# Patient Record
Sex: Male | Born: 1965 | Race: Black or African American | Hispanic: No | Marital: Married | Smoking: Never smoker
Health system: Southern US, Community
[De-identification: ages and names within clinical notes are randomized; demographics above are authoritative.]

## PROBLEM LIST (undated history)

## (undated) HISTORY — PX: KNEE ARTHROSCOPY: SUR90

---

## 2007-06-01 ENCOUNTER — Ambulatory Visit: Payer: Self-pay | Admitting: Internal Medicine

## 2009-10-28 ENCOUNTER — Emergency Department: Payer: Self-pay | Admitting: Unknown Physician Specialty

## 2011-10-22 ENCOUNTER — Ambulatory Visit: Payer: Self-pay | Admitting: Sports Medicine

## 2011-11-14 ENCOUNTER — Ambulatory Visit: Payer: Self-pay | Admitting: Orthopedic Surgery

## 2013-11-05 ENCOUNTER — Ambulatory Visit: Payer: Self-pay | Admitting: Internal Medicine

## 2013-11-06 ENCOUNTER — Ambulatory Visit: Payer: Self-pay

## 2014-09-05 ENCOUNTER — Emergency Department: Payer: Self-pay | Admitting: Emergency Medicine

## 2014-10-09 NOTE — Op Note (Signed)
PATIENT NAME:  Scott Sanders, Scott Sanders MR#:  [id number] DATE OF BIRTH:  25-Jul-1965  DATE OF PROCEDURE:  11/14/2011  PREOPERATIVE DIAGNOSIS: Medial meniscus tear, right knee.   POSTOPERATIVE DIAGNOSIS: Medial meniscus tear, right knee with lateral meniscus tear.   PROCEDURE: Arthroscopy, partial medial and lateral meniscectomy.   SURGEON: Leitha SchullerMichael J. Cledith Kamiya, MD.   ANESTHESIA:  General.   DESCRIPTION OF PROCEDURE: The patient was brought to the Operating Room and after adequate anesthesia was obtained, the right leg was placed in the arthroscopic legholder with a tourniquet applied. After patient identification and time-out procedures were carried out, an inferolateral incision was made and the arthroscope was introduced. Initial inspection revealed some mild central patellar chondromalacia and norma tracking. Coming around to the medial side, an inferomedial portal was made. There was some grade I changes to the condyles, both femoral and tibial. No fissuring of the articular cartilage. On probing, the posterior horn of the meniscus did have a vertical tear of the inner third of almost all the posterior third of the posterior horn of the meniscus. Going to the notch, the anterior cruciate ligament was intact. Going to the lateral compartment, the lateral meniscus was found to have a tear at the junction of the middle and posterior thirds. Radial tear going out approximately half of the thickness of the meniscus. The medial meniscus was then addressed with a meniscal punch and ArthroCare wand. Going to the lateral side, a meniscal punch was initially used followed by the ArthroCare wand, but a shaver was required to debris this back to a stable margin. After this was completed, pre- and postprocedures having been obtained, the gutters were checked. There were no loose bodies. After thorough irrigation of the knee, all instrumentation was withdrawn. The wounds were closed with simple interrupted 4-0 nylon. 20 mL  of 0.5% Sensorcaine infiltrated in the subcutaneous tissue for postoperative analgesia. The wound was then dressed with Xeroform, 4 x 4's, Webril, and Ace wrap. The patient was sent to the recovery room in stable condition.   ESTIMATED BLOOD LOSS: Minimal.   COMPLICATIONS: None.    SPECIMEN: None.   ____________________________ Leitha SchullerMichael J. Karista Aispuro, MD mjm:ap D: 11/14/2011 21:36:06 ET T: 11/15/2011 08:57:03 ET JOB#: [id number]  cc: Leitha SchullerMichael J. Kinsie Belford, MD, <Dictator> Leitha SchullerMICHAEL J Pema Thomure MD ELECTRONICALLY SIGNED 11/15/2011 12:22

## 2014-10-19 ENCOUNTER — Other Ambulatory Visit: Payer: Self-pay | Admitting: Family Medicine

## 2014-10-19 DIAGNOSIS — M5416 Radiculopathy, lumbar region: Secondary | ICD-10-CM

## 2014-10-27 ENCOUNTER — Ambulatory Visit
Admission: RE | Admit: 2014-10-27 | Discharge: 2014-10-27 | Disposition: A | Discharge status: Home / Self Care | Payer: Managed Care, Other (non HMO) | Source: Ambulatory Visit | Attending: Family Medicine | Admitting: Family Medicine

## 2014-10-27 DIAGNOSIS — M5416 Radiculopathy, lumbar region: Secondary | ICD-10-CM | POA: Diagnosis present

## 2014-10-27 DIAGNOSIS — M5126 Other intervertebral disc displacement, lumbar region: Secondary | ICD-10-CM | POA: Insufficient documentation

## 2015-06-20 ENCOUNTER — Ambulatory Visit
Admission: EM | Admit: 2015-06-20 | Discharge: 2015-06-20 | Disposition: A | Discharge status: Hospice | Payer: Managed Care, Other (non HMO) | Attending: Family Medicine | Admitting: Family Medicine

## 2015-06-20 DIAGNOSIS — R21 Rash and other nonspecific skin eruption: Secondary | ICD-10-CM | POA: Diagnosis not present

## 2015-06-20 DIAGNOSIS — L509 Urticaria, unspecified: Secondary | ICD-10-CM

## 2015-06-20 NOTE — ED Provider Notes (Signed)
CSN: [id number]     Arrival date & time 06/20/15  1932 History   First MD Initiated Contact with Patient 06/20/15 2043    Nurses notes were reviewed. Chief Complaint  Patient presents with  . Rash   patient reports scratching and itching in his wrist earlier today. Start having irritation of his feet to the office boot this afternoon no C had large blisters on his left foot as well. He has red rash on both extremities and both hands and wrist.  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 Reports no general malaise just some itching and pain with rashes both the hands and the feet. Still headache and like this before denies being diabetic or take any drugs. (Consider location/radiation/quality/duration/timing/severity/associated sxs/prior Treatment) HPI  No past medical history on file. No past surgical history on file. No family history on file. Social History  Substance Use Topics  . Smoking status: Never Smoker   . Smokeless tobacco: None  . Alcohol Use: No    Review of Systems  All other systems reviewed and are negative.   Allergies  Review of patient's allergies indicates no known allergies.  Home Medications   Prior to Admission medications   Medication Sig Start Date End Date Taking? Authorizing Provider  gabapentin (NEURONTIN) 300 MG capsule Take 300 mg by mouth 2 (two) times daily.   Yes Historical Provider, MD  HYDROcodone-acetaminophen (NORCO/VICODIN) 5-325 MG tablet Take 1 tablet by mouth 2 (two) times daily.   Yes Historical Provider, MD  meloxicam (MOBIC) 15 MG tablet Take 15 mg by mouth daily.   Yes Historical Provider, MD   Meds Ordered and Administered this Visit  Medications - No data to display  BP 124/88 mmHg  Pulse 72  Temp(Src) 98 F (36.7 C) (Oral)  Resp 16  Ht 6\' 1"  (1.854 m)  Wt 232 lb (105.235 kg)  BMI 30.62 kg/m2  SpO2 100% No data found.   Physical Exam  Constitutional: He is oriented to person,  place, and time. He appears well-developed and well-nourished.  HENT:  Head: Normocephalic.  Musculoskeletal: He exhibits no tenderness.  Neurological: He is alert and oriented to person, place, and time.  Skin: Rash noted.     Redness in both wrist areas and some lesions that appear to be urticaria In nature but on the left leg there are multiple blisters and this increased redness up the leg as well on the left side  Psychiatric: He has a normal mood and affect.    ED Course  Procedures (including critical care time)  Labs Review Labs Reviewed - No data to display  Imaging Review No results found.   Visual Acuity Review  Right Eye Distance:   Left Eye Distance:   Bilateral Distance:    Right Eye Near:   Left Eye Near:    Bilateral Near:         MDM   1. Rash   2. Blistering rash   3. Hives of unknown origin    Patient is going to be sent to the ED of his choice which is [facility] facility. Concerned about some type of systemic illness with all 4 extremities having this rash and in the blistering of the left leg makes me very concerned about some type of necrotizing or rapid acting rash and I think that he is probably need multiple different types of immune testing like HIV RPR CBC and possible  blood cultures as  well as starting on IV antibiotics to make sure that this is not a rapid infectious process occurring.     Hassan Rowan, MD 06/20/15 2107

## 2015-06-20 NOTE — ED Notes (Signed)
On bilateral wrists and ankles since 7:00 am today. Affected areas are red and blistered. Pt denies any new laundry detergents, lotions, exposure to new plants or chemicals.

## 2015-06-20 NOTE — Discharge Instructions (Signed)
Hives °Hives are itchy, red, puffy (swollen) areas of the skin. Hives can change in size and location on your body. Hives can come and go for hours, days, or weeks. Hives do not spread from person to person (noncontagious). Scratching, exercise, and stress can make your hives worse. °HOME CARE °· Avoid things that cause your hives (triggers). °· Take antihistamine medicines as told by your doctor. Do not drive while taking an antihistamine. °· Take any other medicines for itching as told by your doctor. °· Wear loose-fitting clothing. °· Keep all doctor visits as told. °GET HELP RIGHT AWAY IF:  °· You have a fever. °· Your tongue or lips are puffy. °· You have trouble breathing or swallowing. °· You feel tightness in the throat or chest. °· You have belly (abdominal) pain. °· You have lasting or severe itching that is not helped by medicine. °· You have painful or puffy joints. °These problems may be the first sign of a life-threatening allergic reaction. Call your local emergency services (911 in [place] °MAKE SURE YOU:  °· Understand these instructions. °· Will watch your condition. °· Will get help right away if you are not doing well or get worse. °  °This information is not intended to replace advice given to you by your health care provider. Make sure you discuss any questions you have with your health care provider. °  °Document Released: 03/12/2008 Document Revised: 12/03/2011 Document Reviewed: 08/27/2011 °Elsevier Interactive Patient Education ©2016 [employer] ° °

## 2015-12-29 ENCOUNTER — Ambulatory Visit (INDEPENDENT_AMBULATORY_CARE_PROVIDER_SITE_OTHER): Payer: Worker's Compensation

## 2015-12-29 ENCOUNTER — Ambulatory Visit
Admission: EM | Admit: 2015-12-29 | Discharge: 2015-12-29 | Disposition: A | Discharge status: Home / Self Care | Payer: Worker's Compensation | Attending: Emergency Medicine | Admitting: Emergency Medicine

## 2015-12-29 ENCOUNTER — Encounter: Payer: Self-pay | Admitting: *Deleted

## 2015-12-29 DIAGNOSIS — M795 Residual foreign body in soft tissue: Secondary | ICD-10-CM

## 2015-12-29 DIAGNOSIS — M549 Dorsalgia, unspecified: Secondary | ICD-10-CM

## 2015-12-29 DIAGNOSIS — S81832A Puncture wound without foreign body, left lower leg, initial encounter: Secondary | ICD-10-CM

## 2015-12-29 MED ORDER — SULFAMETHOXAZOLE-TRIMETHOPRIM 800-160 MG PO TABS: 1.0000 | ORAL_TABLET | Freq: Two times a day (BID) | ORAL | Status: DC

## 2015-12-29 MED ORDER — LIDOCAINE-EPINEPHRINE 1 %-1:100000 IJ SOLN
10.0000 mL | Freq: Once | INTRAMUSCULAR | Status: AC
  Administered 2015-12-29: 1 mL via INTRADERMAL

## 2015-12-29 MED ORDER — ACETAMINOPHEN 500 MG PO TABS
1000.0000 mg | ORAL_TABLET | Freq: Once | ORAL | Status: AC
  Administered 2015-12-29: 1000 mg via ORAL

## 2015-12-29 MED ORDER — MUPIROCIN 2 % EX OINT: 1.0000 "application " | TOPICAL_OINTMENT | Freq: Two times a day (BID) | CUTANEOUS | Status: AC

## 2015-12-29 MED ORDER — IBUPROFEN 800 MG PO TABS: 800.0000 mg | ORAL_TABLET | Freq: Three times a day (TID) | ORAL | Status: DC

## 2015-12-29 MED ORDER — ACETAMINOPHEN 500 MG PO TABS: 1000.0000 mg | ORAL_TABLET | Freq: Four times a day (QID) | ORAL | Status: AC | PRN

## 2015-12-29 NOTE — ED Notes (Signed)
Patient was hit in his left lower leg by a flying piece of metal while at work today. Metal may still be in wound.

## 2015-12-29 NOTE — ED Provider Notes (Signed)
CSN: [id number]     Arrival date & time 12/29/15  1435 History   First MD Initiated Contact with Patient 12/29/15 1521     Chief Complaint  Patient presents with  . Extremity Laceration   (Consider location/radiation/quality/duration/timing/severity/associated sxs/prior Treatment) HPI Comments: [place] male with puncture wound left lower leg at [facility] around 1430 trying to fix machine that extrudes plastic unsure what punctured his skin bleeding controlled with pressure applied to area, area tender to touch, tetanus up to date pain with weight bearing  The history is provided by the patient.    History reviewed. No pertinent past medical history. History reviewed. No pertinent past surgical history. History reviewed. No pertinent family history. Social History  Substance Use Topics  . Smoking status: Never Smoker   . Smokeless tobacco: Never Used  . Alcohol Use: No    Review of Systems  Constitutional: Negative for fever, chills, activity change and appetite change.  HENT: Negative for congestion, ear pain and sore throat.   Eyes: Negative for photophobia, pain, discharge, redness, itching and visual disturbance.  Respiratory: Negative for cough and wheezing.   Cardiovascular: Negative for chest pain and leg swelling.  Gastrointestinal: Negative for nausea, vomiting, diarrhea, constipation and blood in stool.  Endocrine: Negative for polydipsia, polyphagia and polyuria.  Genitourinary: Negative for dysuria, hematuria and difficulty urinating.  Musculoskeletal: Positive for myalgias and gait problem. Negative for back pain and arthralgias.  Skin: Positive for wound. Negative for color change, pallor and rash.  Allergic/Immunologic: Negative for environmental allergies and food allergies.  Neurological: Negative for headaches.  Hematological: Negative for adenopathy. Does not bruise/bleed easily.  Psychiatric/Behavioral: Negative for confusion, sleep disturbance and  agitation. The patient is not nervous/anxious.     Allergies  Review of patient's allergies indicates no known allergies.  Home Medications   Prior to Admission medications   Medication Sig Start Date End Date Taking? Authorizing Provider  acetaminophen (TYLENOL) 500 MG tablet Take 2 tablets (1,000 mg total) by mouth every 6 (six) hours as needed for mild pain or moderate pain. 12/29/15 12/31/15  Barbaraann Barthel, NP  ibuprofen (ADVIL,MOTRIN) 800 MG tablet Take 1 tablet (800 mg total) by mouth 3 (three) times daily. 12/29/15   Barbaraann Barthel, NP  mupirocin ointment (BACTROBAN) 2 % Apply 1 application topically 2 (two) times daily. Affected area until healed 12/29/15 01/07/16  Barbaraann Barthel, NP  sulfamethoxazole-trimethoprim (BACTRIM DS,SEPTRA DS) 800-160 MG tablet Take 1 tablet by mouth 2 (two) times daily. 12/29/15   Barbaraann Barthel, NP   Meds Ordered and Administered this Visit   Medications  acetaminophen (TYLENOL) tablet 1,000 mg (1,000 mg Oral Given 12/29/15 1625)  lidocaine-EPINEPHrine (XYLOCAINE W/EPI) 1 %-1:100000 (with pres) injection 10 mL (1 mL Intradermal Given 12/29/15 1725)    BP 138/71 mmHg  Pulse 72  Temp(Src) 98 F (36.7 C) (Oral)  Resp 18  Ht 6\' 1"  (1.854 m)  Wt 230 lb (104.327 kg)  BMI 30.35 kg/m2  SpO2 100% No data found.   Physical Exam  Constitutional: He is oriented to person, place, and time. Vital signs are normal. He appears well-developed and well-nourished.  Non-toxic appearance. He does not have a sickly appearance. He does not appear ill. No distress.  HENT:  Head: Normocephalic and atraumatic.  Right Ear: Hearing and external ear normal.  Left Ear: Hearing and external ear normal.  Nose: Nose normal.  Mouth/Throat: Uvula is midline, oropharynx is clear and moist and mucous membranes are normal.  He does not have dentures. No oral lesions. No trismus in the jaw. Normal dentition. No dental abscesses, uvula swelling, lacerations or dental caries.   Eyes: Conjunctivae, EOM and lids are normal. Pupils are equal, round, and reactive to light. Right eye exhibits no discharge. Left eye exhibits no discharge. No scleral icterus.  Neck: Trachea normal and normal range of motion. Neck supple. No spinous process tenderness and no muscular tenderness present. No rigidity. No tracheal deviation, no edema, no erythema and normal range of motion present.  Cardiovascular: Normal rate, regular rhythm, normal heart sounds and intact distal pulses.  Exam reveals no gallop and no friction rub.   No murmur heard. Pulses:      Dorsalis pedis pulses are 2+ on the right side, and 2+ on the left side.  Pulmonary/Chest: Effort normal and breath sounds normal. No accessory muscle usage or stridor. No respiratory distress. He has no decreased breath sounds. He has no wheezes. He has no rhonchi. He has no rales. He exhibits no tenderness.  Abdominal: Soft. Normal appearance. He exhibits no distension.  Musculoskeletal: Normal range of motion. He exhibits edema and tenderness.       Right shoulder: Normal.       Left shoulder: Normal.       Right hip: Normal.       Left hip: Normal.       Right knee: Normal.       Left knee: Normal.       Right ankle: Normal.       Left ankle: Normal.       Cervical back: Normal.       Right hand: Normal.       Left hand: Normal.       Right lower leg: Normal.       Left lower leg: He exhibits tenderness, swelling, edema and laceration. He exhibits no deformity.       Legs: Neurological: He is alert and oriented to person, place, and time. He has normal strength. He displays no atrophy and no tremor. No cranial nerve deficit or sensory deficit. He exhibits normal muscle tone. He displays no seizure activity. Gait abnormal. Coordination normal. GCS eye subscore is 4. GCS verbal subscore is 5. GCS motor subscore is 6.  Limping strength 5/5 bilateral extremities  Skin: Skin is warm. Laceration noted. No abrasion, no bruising, no  burn, no ecchymosis, no lesion, no petechiae and no rash noted. He is not diaphoretic. There is erythema. No cyanosis. No pallor. Nails show no clubbing.  Psychiatric: He has a normal mood and affect. His speech is normal and behavior is normal. Judgment and thought content normal. Cognition and memory are normal.    ED Course  Procedures (including critical care time)  Labs Review Labs Reviewed - No data to display  Imaging Review Dg Tibia/fibula Left  12/29/2015  ADDENDUM REPORT: 12/29/2015 17:25 ADDENDUM: Radio-opaque foreign bodies are identified within the medial soft tissues of the left lower leg. Electronically Signed   By: Signa Kell M.D.   On: 12/29/2015 17:25  12/29/2015  CLINICAL DATA:  Laceration from piece of metal. EXAM: LEFT TIBIA AND FIBULA - 2 VIEW COMPARISON:  None FINDINGS: A fiducial marker was placed over the area of concern. Within the underlying soft tissues there are two small radio-opaque foreign bodies which up to 7 mm. No fracture or subluxation identified. IMPRESSION: 1. Radio-opaque foreign bodies are identified within the medial soft tissues of the right lower leg. Electronically Signed:  By: Signa Kellaylor  Stroud M.D. On: 12/29/2015 15:46    Xray for foreign body/fracture pending 1521  Simple dressing and wound care completed by RN Alphonzo LemmingsSteve Holmes refused offer tylenol or motrin at this time.  1605 patient notified 2 foreign bodies noted on xray given copy of report.  Awaiting Dr Chaney MallingMortenson to perform foreign body removal as splint my left hand unable to wear glove/manipulate tools.  Patient verbalized understanding information and had no further questions at this time.  Medications  acetaminophen (TYLENOL) tablet 1,000 mg (1,000 mg Oral Given 12/29/15 1625)  lidocaine-EPINEPHrine (XYLOCAINE W/EPI) 1 %-1:100000 (with pres) injection 10 mL (1 mL Intradermal Given 12/29/15 1725)  by RN Alphonzo LemmingsSteve Holmes as patient changed mind requested pain medication while waiting for foreign  body removal.  1725 foreign body removal and simple suturing completed by Dr Dimitri PedAshley Mortensen see her procedure note.  Simple dressing LLE CDI patient ambulated to bathroom without difficulty and back to exam room after procedure.  Reviewed discharge instructions with patient and [facility]  Patient verbalized understanding info/instructions, agreed plan of care and no further questions at this time.   MDM   1. Puncture wound of leg excluding thigh, left, initial encounter   2. Foreign body (FB) in soft tissue    Patient was instructed to keep wound covered with bandaid apply triple antibiotic daily after showering.  Follow up in 10 days for suture removal.  48 hours work excuse given to patient.  Monitor for erythema worsening, purulent discharge, worsening pain and if noted start bactroban and bactrim DS po BID x 7 days.  Do not soak toe until wound healed avoid pool, lake, hot tub.   Exitcare handout on puncture wound, cellulitis, osteomyelitis given to patient.  Tylenol 1000mg  po QID prn or motrin 800mg  po TID prn pain.   Medications as directed.  Call or return to clinic as needed if these symptoms worsen or fail to improve as anticipated..  Patient verbalized agreement and understanding of treatment plan and had no further questions at this time.  P2:  ROM, injury prevention    Barbaraann Barthelina A Betancourt, NP 12/29/15 1802

## 2015-12-29 NOTE — Discharge Instructions (Signed)
Puncture Wound A puncture wound is an injury that is caused by a sharp, thin object that goes through (penetrates) your skin. Usually, a puncture wound does not leave a large opening in your skin, so it may not bleed a lot. However, when you get a puncture wound, dirt or other materials (foreign bodies) can be forced into your wound and break off inside. This increases the chance of infection, such as tetanus. CAUSES Puncture wounds are caused by any sharp, thin object that goes through your skin, such as:  Animal teeth, as with an animal bite.  Sharp, pointed objects, such as nails, splinters of glass, fishhooks, and needles. SYMPTOMS Symptoms of a puncture wound include:  Pain.  Bleeding.  Swelling.  Bruising.  Fluid leaking from the wound.  Numbness, tingling, or loss of function. DIAGNOSIS This condition is diagnosed with a medical history and physical exam. Your wound will be checked to see if it contains any foreign bodies. You may also have X-rays or other imaging tests. TREATMENT Treatment for a puncture wound depends on how serious the wound is. It also depends on whether the wound contains any foreign bodies. Treatment for all types of puncture wounds usually starts with:  Controlling the bleeding.  Washing out the wound with a germ-free (sterile) salt-water solution.  Checking the wound for foreign bodies. Treatment may also include:  Having the wound opened surgically to remove a foreign object.  Closing the wound with stitches (sutures) if it continues to bleed.  Covering the wound with antibiotic ointments and a bandage (dressing).  Receiving a tetanus shot.  Receiving a rabies vaccine. HOME CARE INSTRUCTIONS Medicines  Take or apply over-the-counter and prescription medicines only as told by your health care provider.  If you were prescribed an antibiotic, take or apply it as told by your health care provider. Do not stop using the antibiotic even if  your condition improves. Wound Care  There are many ways to close and cover a wound. For example, a wound can be covered with sutures, skin glue, or adhesive strips. Follow instructions from your health care provider about:  How to take care of your wound.  When and how you should change your dressing.  When you should remove your dressing.  Removing whatever was used to close your wound.  Keep the dressing dry as told by your health care provider. Do not take baths, swim, use a hot tub, or do anything that would put your wound underwater until your health care provider approves.  Clean the wound as told by your health care provider.  Do not scratch or pick at the wound.  Check your wound every day for signs of infection. Watch for:  Redness, swelling, or pain.  Fluid, blood, or pus. General Instructions  Raise (elevate) the injured area above the level of your heart while you are sitting or lying down.  If your puncture wound is in your foot, ask your health care provider if you need to avoid putting weight on your foot and for how long.  Keep all follow-up visits as told by your health care provider. This is important. SEEK MEDICAL CARE IF:  You received a tetanus shot and you have swelling, severe pain, redness, or bleeding at the injection site.  You have a fever.  Your sutures come out.  You notice a bad smell coming from your wound or your dressing.  You notice something coming out of your wound, such as wood or glass.  Your  pain is not controlled with medicine.  You have increased redness, swelling, or pain at the site of your wound.  You have fluid, blood, or pus coming from your wound.  You notice a change in the color of your skin near your wound.  You need to change the dressing frequently due to fluid, blood, or pus draining from your wound.  You develop a new rash.  You develop numbness around your wound. SEEK IMMEDIATE MEDICAL CARE IF:  You  develop severe swelling around your wound.  Your pain suddenly increases and is severe.  You develop painful skin lumps.  You have a red streak going away from your wound.  The wound is on your hand or foot and you cannot properly move a finger or toe.  The wound is on your hand or foot and you notice that your fingers or toes look pale or bluish.   This information is not intended to replace advice given to you by your health care provider. Make sure you discuss any questions you have with your health care provider.   Document Released: 03/13/2005 Document Revised: 02/22/2015 Document Reviewed: 07/27/2014 Elsevier Interactive Patient Education 2016 Elsevier Inc. Bone and Joint Infections, Adult Bone infections (osteomyelitis) and joint infections (septic arthritis) occur when bacteria or other germs get inside a bone or a joint. This can happen if you have an infection in another part of your body that spreads through your blood. Germs from your skin or from outside of your body can also cause this type of infection if you have a wound or a broken bone (fracture) that breaks the skin. Anyone can get a bone infection or joint infection. You may be more likely to get this type of infection if you have a condition, such as diabetes, that lowers your ability to fight infection or increases your chances of getting an infection. Bone and joint infections can cause damage, and they can spread to other areas of your body. They need to be treated quickly. CAUSES Most bone and joint infections are caused by bacteria. They can also be caused by other germs, such as viruses and funguses. RISK FACTORS This condition is more likely to develop in:  People who recently had surgery, especially bone or joint surgery.  People who have a long-term (chronic) disease, such as:  HIV (human immunodeficiency virus).  Diabetes.  Rheumatoid arthritis.  Sickle cell anemia.  Elderly people.  People who  take medicines that block or weaken the body's defense system (immune system).  People who have a condition that reduces their blood flow.  People who are on kidney dialysis.  People who have an artificial joint.  People who have had a joint or bone repaired with plates or screws (surgical hardware).  People who use or abuse IV drugs.  People who have had trauma, such as stepping on a nail. SYMPTOMS Symptoms vary depending on the type and location of your infection. Common symptoms of bone and joint infections include:  Fever and chills.  Redness and warmth.  Swelling.  Pain and stiffness.  Drainage of fluid or pus near the infection.  Weight loss and fatigue.  Decreased ability to use a hand or foot. DIAGNOSIS This condition may be diagnosed based on symptoms, medical history, a physical exam, and diagnostic tests. Tests can help to identify the cause of the infection. You may have various tests, such as:  A sample of tissue, fluid, or blood taken to be examined under a microscope.  A procedure to remove fluid from the infected joint with a needle (joint aspiration) for testing in a lab.  Pus or discharge swabbed from a wound for testing to identify germs and to determine what type of medicine will kill them (culture and sensitivity).  Blood tests to look for evidence of infection and inflammation (biomarkers).  Imaging studies to determine how severe the bone or joint infection is. These may include:  X-rays.  CT scan.  MRI.  Bone scan. TREATMENT Treatment depends on the cause and type of infection. Antibiotic medicines are usually the first treatment for a bone or joint infection. Treatment with antibiotics may include:  Getting IV antibiotics. This may be done in a hospital at first. You may have to continue IV antibiotics at home for several weeks. You may also have to take antibiotics by mouth for several weeks after that.  Taking more than one kind of  antibiotic. Treatment may start with a type of antibiotic that works against many different bacteria (broad spectrumantibiotics). IV antibiotics may be changed if tests show that another type may work better. Other treatments may include:  Draining fluid from the joint by placing a needle into it (aspiration).  Surgery to remove:  Dead or dying tissue from a bone or joint.  An infected artificial joint.  Infected plates or screws that were used to repair a broken bone. HOME CARE INSTRUCTIONS  Take medicines only as directed by your health care provider.  Take your antibiotic medicine as directed by your health care provider. Finish the antibiotic even if you start to feel better.  Follow instructions from your health care provider about how to take IV antibiotics at home.  Ask your health care provider if you have any restrictions on your activities.  Keep all follow-up visits as directed by your health care provider. This is important. SEEK MEDICAL CARE IF:  You have a fever or chills.  You have redness, warmth, pain, or swelling that returns after treatment. SEEK IMMEDIATE MEDICAL CARE IF:  You have rapid breathing or you have trouble breathing.  You have chest pain.  You cannot drink fluids or make urine.  The affected arm or leg swells, changes color, or turns blue.   This information is not intended to replace advice given to you by your health care provider. Make sure you discuss any questions you have with your health care provider.   Document Released: 06/03/2005 Document Revised: 10/18/2014 Document Reviewed: 06/01/2014 Elsevier Interactive Patient Education 2016 Elsevier Inc. Cellulitis Cellulitis is an infection of the skin and the tissue beneath it. The infected area is usually red and tender. Cellulitis occurs most often in the arms and lower legs.  CAUSES  Cellulitis is caused by bacteria that enter the skin through cracks or cuts in the skin. The most  common types of bacteria that cause cellulitis are staphylococci and streptococci. SIGNS AND SYMPTOMS   Redness and warmth.  Swelling.  Tenderness or pain.  Fever. DIAGNOSIS  Your health care provider can usually determine what is wrong based on a physical exam. Blood tests may also be done. TREATMENT  Treatment usually involves taking an antibiotic medicine. HOME CARE INSTRUCTIONS   Take your antibiotic medicine as directed by your health care provider. Finish the antibiotic even if you start to feel better.  Keep the infected arm or leg elevated to reduce swelling.  Apply a warm cloth to the affected area up to 4 times per day to relieve pain.  Take  medicines only as directed by your health care provider.  Keep all follow-up visits as directed by your health care provider. SEEK MEDICAL CARE IF:   You notice red streaks coming from the infected area.  Your red area gets larger or turns dark in color.  Your bone or joint underneath the infected area becomes painful after the skin has healed.  Your infection returns in the same area or another area.  You notice a swollen bump in the infected area.  You develop new symptoms.  You have a fever. SEEK IMMEDIATE MEDICAL CARE IF:   You feel very sleepy.  You develop vomiting or diarrhea.  You have a general ill feeling (malaise) with muscle aches and pains.   This information is not intended to replace advice given to you by your health care provider. Make sure you discuss any questions you have with your health care provider.   Document Released: 03/13/2005 Document Revised: 02/22/2015 Document Reviewed: 08/19/2011 Elsevier Interactive Patient Education 2016 Elsevier Inc. Sutured Wound Care Sutures are stitches that can be used to close wounds. Taking care of your wound properly can help to prevent pain and infection. It can also help your wound to heal more quickly. HOW TO CARE FOR YOUR SUTURED WOUND Wound  Care  Keep the wound clean and dry.  If you were given a bandage (dressing), you should change it at least once per day or as directed by your health care provider. You should also change it if it becomes wet or dirty.  Keep the wound completely dry for the first 24 hours or as directed by your health care provider. After that time, you may shower or bathe. However, make sure that the wound is not soaked in water until the sutures have been removed.  Clean the wound one time each day or as directed by your health care provider.  Wash the wound with soap and water.  Rinse the wound with water to remove all soap.  Pat the wound dry with a clean towel. Do not rub the wound.  Aftercleaning the wound, apply a thin layer of antibioticointment as directed by your health care provider. This will help to prevent infection and keep the dressing from sticking to the wound.  Have the sutures removed as directed by your health care provider. General Instructions  Take or apply medicines only as directed by your health care provider.  To help prevent scarring, make sure to cover your wound with sunscreen whenever you are outside after the sutures are removed and the wound is healed. Make sure to wear a sunscreen of at least 30 SPF.  If you were prescribed an antibiotic medicine or ointment, finish all of it even if you start to feel better.  Do not scratch or pick at the wound.  Keep all follow-up visits as directed by your health care provider. This is important.  Check your wound every day for signs of infection. Watch for:   Redness, swelling, or pain.  Fluid, blood, or pus.  Raise (elevate) the injured area above the level of your heart while you are sitting or lying down, if possible.  Avoid stretching your wound.  Drink enough fluids to keep your urine clear or pale yellow. SEEK MEDICAL CARE IF:  You received a tetanus shot and you have swelling, severe pain, redness, or  bleeding at the injection site.  You have a fever.  A wound that was closed breaks open.  You notice a bad smell  coming from the wound.  You notice something coming out of the wound, such as wood or glass.  Your pain is not controlled with medicine.  You have increased redness, swelling, or pain at the site of your wound.  You have fluid, blood, or pus coming from your wound.  You notice a change in the color of your skin near your wound.  You need to change the dressing frequently due to fluid, blood, or pus draining from the wound.  You develop a new rash.  You develop numbness around the wound. SEEK IMMEDIATE MEDICAL CARE IF:  You develop severe swelling around the injury site.  Your pain suddenly increases and is severe.  You develop painful lumps near the wound or on skin that is anywhere on your body.  You have a red streak going away from your wound.  The wound is on your hand or foot and you cannot properly move a finger or toe.  The wound is on your hand or foot and you notice that your fingers or toes look pale or bluish.   This information is not intended to replace advice given to you by your health care provider. Make sure you discuss any questions you have with your health care provider.   Document Released: 07/11/2004 Document Revised: 10/18/2014 Document Reviewed: 01/13/2013 Elsevier Interactive Patient Education [employer]2016 Elsevier Inc.

## 2015-12-29 NOTE — ED Provider Notes (Signed)
Procedure note: After obtaining verbal consent, the area was cleansed with chlorhexidine gluconate. Using sterile technique, 3 mL of lidocaine 1% with epinephrine was infiltrated with good local anesthesia. The wound was extended approximately 2 mm and explored. 2 metallic foreign bodies were removed in their entirety. Wound was then irrigated with 120 cc of sterile saline. A single vertical mattress and single interrupted stitch was placed to close the wound using 4-0 Ethilon. Bacitracin and pressure dressing applied afterwards. Patient tolerated procedure well.  Domenick GongAshley Mickenzie Stolar, MD 12/29/15 1714

## 2015-12-30 ENCOUNTER — Encounter: Payer: Self-pay | Admitting: *Deleted

## 2015-12-30 ENCOUNTER — Ambulatory Visit
Admission: EM | Admit: 2015-12-30 | Discharge: 2015-12-30 | Disposition: A | Discharge status: Home / Self Care | Payer: Worker's Compensation | Attending: Internal Medicine | Admitting: Internal Medicine

## 2015-12-30 DIAGNOSIS — M795 Residual foreign body in soft tissue: Secondary | ICD-10-CM | POA: Diagnosis not present

## 2015-12-30 DIAGNOSIS — S81832D Puncture wound without foreign body, left lower leg, subsequent encounter: Secondary | ICD-10-CM | POA: Diagnosis not present

## 2015-12-30 MED ORDER — ETODOLAC 500 MG PO TABS: 500.0000 mg | ORAL_TABLET | Freq: Two times a day (BID) | ORAL | Status: DC

## 2015-12-30 MED ORDER — HYDROCODONE-ACETAMINOPHEN 5-325 MG PO TABS: 1.0000 | ORAL_TABLET | Freq: Three times a day (TID) | ORAL | Status: DC | PRN

## 2015-12-30 NOTE — ED Provider Notes (Signed)
CSN: [id number]     Arrival date & time 12/30/15  1025 History   First MD Initiated Contact with Patient 12/30/15 1041     Chief Complaint  Patient presents with  . Leg Pain   HPI The patient presents today with increased left leg pain.  [facility] with [facility].  X-rays demonstrated a metallic foreign object which was removed in the office.  Sutures were placed and he was given tylenol for pain.  He reports that the pain around the incision is extremely painful, describes it as a sharp sensation.  Denies any radiation of the pain, or N/T in the left lower extremity.  Denies any fevers or drainage from the incision site, presents today for further pain control.  History reviewed. No pertinent past medical history. History reviewed. No pertinent past surgical history. History reviewed. No pertinent family history. Social History  Substance Use Topics  . Smoking status: Never Smoker   . Smokeless tobacco: Never Used  . Alcohol Use: No    Review of Systems  Constitutional: Negative.   HENT: Negative.   Eyes: Negative.   Respiratory: Negative.   Cardiovascular: Negative.   Gastrointestinal: Negative.   Endocrine: Negative.   Genitourinary: Negative.   Musculoskeletal: Positive for myalgias and arthralgias.  Skin: Negative.   Allergic/Immunologic: Negative.   Neurological: Negative.   Hematological: Negative.   Psychiatric/Behavioral: Negative.     Allergies  Review of patient's allergies indicates no known allergies.  Home Medications   Prior to Admission medications   Medication Sig Start Date End Date Taking? Authorizing Provider  acetaminophen (TYLENOL) 500 MG tablet Take 2 tablets (1,000 mg total) by mouth every 6 (six) hours as needed for mild pain or moderate pain. 12/29/15 12/31/15 Yes Barbaraann Barthelina A Betancourt, NP  ibuprofen (ADVIL,MOTRIN) 800 MG tablet Take 1 tablet (800 mg  total) by mouth 3 (three) times daily. 12/29/15  Yes Barbaraann Barthelina A Betancourt, NP  mupirocin ointment (BACTROBAN) 2 % Apply 1 application topically 2 (two) times daily. Affected area until healed 12/29/15 01/07/16 Yes Tina A Betancourt, NP  sulfamethoxazole-trimethoprim (BACTRIM DS,SEPTRA DS) 800-160 MG tablet Take 1 tablet by mouth 2 (two) times daily. 12/29/15  Yes Barbaraann Barthelina A Betancourt, NP  etodolac (LODINE) 500 MG tablet Take 1 tablet (500 mg total) by mouth 2 (two) times daily. 12/30/15   Anson OregonJames Lance McGhee, PA-C  HYDROcodone-acetaminophen (NORCO) 5-325 MG tablet Take 1 tablet by mouth every 8 (eight) hours as needed for moderate pain. 12/30/15   Anson OregonJames Lance McGhee, PA-C   Meds Ordered and Administered this Visit  Medications - No data to display  BP 128/94 mmHg  Pulse 76  Temp(Src) 98.2 F (36.8 C) (Oral)  Resp 18  SpO2 100% No data found.  Physical Exam  skin inspection of the left lower extremity demonstrates that be incision site along the medial aspect of the left tibia is intact.  Mild swelling at the site of the incision There is no drainage or erythema. The patient has full range of motion left knee and left ankle without pain. There is moderate tenderness palpation around the incision site, compartments of the left leg are soft. Sutures remain intact.  ED Course  Procedures (including critical care time)  Labs Review Labs Reviewed - No data to display  Imaging Review Dg Tibia/fibula Left  12/29/2015  ADDENDUM REPORT: 12/29/2015 17:25 ADDENDUM: Radio-opaque foreign bodies  are identified within the medial soft tissues of the left lower leg. Electronically Signed   By: Signa Kell M.D.   On: 12/29/2015 17:25  12/29/2015  CLINICAL DATA:  Laceration from piece of metal. EXAM: LEFT TIBIA AND FIBULA - 2 VIEW COMPARISON:  None FINDINGS: A fiducial marker was placed over the area of concern. Within the underlying soft tissues there are two small radio-opaque foreign bodies which up to 7 mm. No  fracture or subluxation identified. IMPRESSION: 1. Radio-opaque foreign bodies are identified within the medial soft tissues of the right lower leg. Electronically Signed: By: Signa Kell M.D. On: 12/29/2015 15:46   MDM   1. Foreign body in soft tissue   2. Puncture wound of leg excluding thigh, left, subsequent encounter   -Pt was prescribed Lodine for inflammation as well as Norco as needed for breakthrough pain. -Follow-up in 10 days for suture removal. -Continue taking Bactrim until completed regimen.    Anson Oregon, [place] 12/30/15 1124

## 2015-12-30 NOTE — Discharge Instructions (Signed)
-  Continue to keep area covered and dry -Lodine and tylenol for discomfort, Norco for breakthrough pain -[facility] if symptoms worsen or fail to improve.

## 2015-12-30 NOTE — ED Notes (Signed)
[facility] after being seen yesterday for a puncture wound to his left lower leg. Patient is complaining of severe pain.

## 2016-01-08 ENCOUNTER — Ambulatory Visit
Admission: EM | Admit: 2016-01-08 | Discharge: 2016-01-08 | Disposition: A | Discharge status: Home / Self Care | Payer: Worker's Compensation | Attending: Family Medicine | Admitting: Family Medicine

## 2016-01-08 ENCOUNTER — Ambulatory Visit (INDEPENDENT_AMBULATORY_CARE_PROVIDER_SITE_OTHER): Payer: Worker's Compensation

## 2016-01-08 ENCOUNTER — Encounter: Payer: Self-pay | Admitting: *Deleted

## 2016-01-08 DIAGNOSIS — M79605 Pain in left leg: Secondary | ICD-10-CM

## 2016-01-08 DIAGNOSIS — Z4802 Encounter for removal of sutures: Secondary | ICD-10-CM | POA: Diagnosis not present

## 2016-01-08 NOTE — ED Triage Notes (Signed)
Pt seen here ten days ago for suture of a laceration on his left calf. Now here for suture removal and re-evaluation of his left lg as the area around the suture site is very painful and tender to touch and the area opposite side of leg is numb.

## 2016-01-08 NOTE — ED Provider Notes (Signed)
CSN: [id number]     Arrival date & time 01/08/16  1207 History   None    Chief Complaint  Patient presents with  . Leg Pain  . Suture / Staple Removal   (Consider location/radiation/quality/duration/timing/severity/associated sxs/prior Treatment) HPI  This a 50 year old male who returns today for removal of his sutures. However he states that he's been having some problems since the foreign body was removed. He states that the lateral aspect of his proximal leg over approximately 8 inch area that is well delineated is numb to the touch. States that it feels that more prevalently when he presses on the area that was sutured. He also tells me that they did not obtain an x-ray of the post removal.     History reviewed. No pertinent past medical history. Past Surgical History:  Procedure Laterality Date  . KNEE ARTHROSCOPY Right    History reviewed. No pertinent family history. Social History  Substance Use Topics  . Smoking status: Never Smoker  . Smokeless tobacco: Never Used  . Alcohol use No    Review of Systems  Constitutional: Negative for chills, fatigue and fever.  Skin: Positive for wound. Negative for color change.  All other systems reviewed and are negative.   Allergies  Review of patient's allergies indicates no known allergies.  Home Medications   Prior to Admission medications   Medication Sig Start Date End Date Taking? Authorizing Provider  etodolac (LODINE) 500 MG tablet Take 1 tablet (500 mg total) by mouth 2 (two) times daily. 12/30/15  Yes Anson [place], PA-C  HYDROcodone-acetaminophen (NORCO) 5-325 MG tablet Take 1 tablet by mouth every 8 (eight) hours as needed for moderate pain. 12/30/15   Anson [place], PA-C  ibuprofen (ADVIL,MOTRIN) 800 MG tablet Take 1 tablet (800 mg total) by mouth 3 (three) times daily. 12/29/15   Barbaraann Barthel, NP  sulfamethoxazole-trimethoprim (BACTRIM DS,SEPTRA DS) 800-160 MG tablet Take 1 tablet by mouth 2 (two)  times daily. 12/29/15   Barbaraann Barthel, NP   Meds Ordered and Administered this Visit  Medications - No data to display  BP 125/90 (BP Location: Left Arm)   Pulse 71   Temp 98 F (36.7 C) (Oral)   Resp 16   Ht 6\' 1"  (1.854 m)   Wt 236 lb (107 kg)   SpO2 99%   BMI 31.14 kg/m  No data found.   Physical Exam  Constitutional: He is oriented to person, place, and time. He appears well-developed and well-nourished. No distress.  HENT:  Head: Normocephalic and atraumatic.  Eyes: Pupils are equal, round, and reactive to light.  Neck: Normal range of motion. Neck supple.  Musculoskeletal: Normal range of motion. He exhibits tenderness. He exhibits no edema or deformity.  Semination of the left leg shows a well-healed sutured laceration just medial to the anterior tibial spine. He outlines an well demarcated area of numbness measuring approximately 8 inches in length and 2 inches wide. In addition he states that when he presses on the laceration over the medial proximal leg he will increase the numbness on that demarcated area. He has good muscle function with good anterior tibialis strength.  Neurological: He is alert and oriented to person, place, and time.  Skin: Skin is warm and dry. No rash noted. He is not diaphoretic. No erythema. No pallor.  Psychiatric: He has a normal mood and affect. His behavior is normal. Judgment and thought content normal.  Nursing note and vitals reviewed.   Urgent  Care Course   Clinical Course    Procedures (including critical care time)  Labs Review Labs Reviewed - No data to display  Imaging Review Dg Tibia/fibula Left  Result Date: 01/08/2016 CLINICAL DATA:  Pt was in Urgent care 10 days ago for FB. He is a Chartered certified accountant at [employer] and a piece of metal got stuck in medial proximal lower leg. Had metal removed but no re-xray. Pt here today for follow up and has a lot of tenderness over the sutured area with numbness down the lateral lower leg.  EXAM: LEFT TIBIA AND FIBULA - 2 VIEW COMPARISON:  12/29/2015 FINDINGS: No residual foreign body. No fracture or bone lesion. No bone resorption is seen to suggest osteomyelitis. No soft tissue air. IMPRESSION: 1. No residual foreign body.  No soft tissue air. 2. No evidence of osteomyelitis. Electronically Signed   By: Amie Portland M.D.   On: 01/08/2016 14:29    Visual Acuity Review  Right Eye Distance:   Left Eye Distance:   Bilateral Distance:    Right Eye Near:   Left Eye Near:    Bilateral Near:         MDM   1. Visit for suture removal   2. Left leg pain    After obtaining x-rays of the leg I have assured the patient that there was no retained foreign bodies. We will remove the sutures at this time. I told him that the area of numbness may improve over time but if he does not notice improvement in 6 months he should probably see an orthopedic surgeon for evaluation. Otherwise he should expect resolution of his symptoms. Appears to be affecting only the sensory function and not a motor.    Lutricia Feil, PA-C 01/08/16 1504

## 2016-09-10 ENCOUNTER — Ambulatory Visit
Admission: EM | Admit: 2016-09-10 | Discharge: 2016-09-10 | Disposition: A | Discharge status: Home / Self Care | Payer: Worker's Compensation | Attending: Family Medicine | Admitting: Family Medicine

## 2016-09-10 DIAGNOSIS — M79662 Pain in left lower leg: Secondary | ICD-10-CM

## 2016-09-10 DIAGNOSIS — R200 Anesthesia of skin: Secondary | ICD-10-CM

## 2016-09-10 NOTE — ED Provider Notes (Signed)
CSN: [id number]     Arrival date & time 09/10/16  1054 History   None    Chief Complaint  Patient presents with  . Follow-up    left leg injury- WC   (Consider location/radiation/quality/duration/timing/severity/associated sxs/prior Treatment) HPI  Is a 51 year old male who presented here initially in July 2017 he had a puncture wound to his left lower leg on the medial aspect inferior to the tibial tubercle medial to the anterior tibial spine. It was surgically removed 2 pieces of metallic foreign body treated without incident. Patient returned on 2 other occasions the last  time he was still complaining of pain and numbness. The numbness is in an area roughly 5 cm x 6 cm lateral portion of his left lower leg. He presents today with recurrent continuing symptoms. It all appears to be  sensoryl and does not involve motor.  He is concerned with the amount of time that it has been present.        History reviewed. No pertinent past medical history. Past Surgical History:  Procedure Laterality Date  . KNEE ARTHROSCOPY Right    History reviewed. No pertinent family history. Social History  Substance Use Topics  . Smoking status: Never Smoker  . Smokeless tobacco: Never Used  . Alcohol use No    Review of Systems  Constitutional: Negative for activity change, chills, fatigue and fever.  Skin: Negative for color change.  Neurological: Positive for numbness.  All other systems reviewed and are negative.   Allergies  Patient has no known allergies.  Home Medications   Prior to Admission medications   Not on File   Meds Ordered and Administered this Visit  Medications - No data to display  BP (!) 152/88 (BP Location: Left Arm)   Pulse 65   Temp 98 F (36.7 C) (Oral)   Resp 18   Ht 6\' 1"  (1.854 m)   Wt 235 lb (106.6 kg)   SpO2 99%   BMI 31.00 kg/m  No data found.   Physical Exam  Constitutional: He is oriented to person, place, and time. He appears well-developed  and well-nourished. No distress.  HENT:  Head: Normocephalic and atraumatic.  Eyes: Pupils are equal, round, and reactive to light.  Neck: Normal range of motion.  Neurological: He is alert and oriented to person, place, and time.  Skin: Skin is warm and dry. He is not diaphoretic.  There is a well-healed small puncture area medial left leg that when pressed causes him to have numbness pain over the lateral left leg and area roughly 5 x 6 cm. Is very well-circumscribed. He can delineate outline perfectly every time. It is hip aesthetic to touch. Motors are intact. Pulses are intact distally.  Psychiatric: He has a normal mood and affect. His behavior is normal. Judgment and thought content normal.  Nursing note and vitals reviewed.   Urgent Care Course     Procedures (including critical care time)  Labs Review Labs Reviewed - No data to display  Imaging Review No results found.   Visual Acuity Review  Right Eye Distance:   Left Eye Distance:   Bilateral Distance:    Right Eye Near:   Left Eye Near:    Bilateral Near:         MDM   1. Left leg numbness    Recommend that the patient be evaluated by a neurologist or orthopedic surgeon because of his continued numbness. In 8 months since initial injury continues to have  the numbness contralateral to the actual puncture wound. Ambulation should be able to work full-time with no restrictions as of 12 motors but is purely sensory to my examination. Note was written for the patient to carry back to the human resources for of the referral.    Lutricia Feil, PA-C 09/10/16 1254

## 2016-09-10 NOTE — Discharge Instructions (Signed)
Recommend patient be evaluated by neurologist because of his lower extremity numbness adjacent to where he had a foreign body in his leg.

## 2016-09-10 NOTE — ED Triage Notes (Addendum)
Back in July the patient came here for a WC left leg injury. He had a piece of metal removed from that leg here and stitches placed. When the stitches were removed he mentioned that his leg was still numb in that area, he was given the advice to get it some time to see if the feeling would come back. He is here today because it is still numb. There is also pain when you touch or hit that area.

## 2017-10-25 IMAGING — CR DG TIBIA/FIBULA 2V*L*
4 series · 4 of 4 positions shown · non-contrast
Comparison: None

ADDENDUM:
Radio-opaque foreign bodies are identified within the medial soft
tissues of the left lower leg.
CLINICAL DATA: Laceration from piece of metal.

EXAM:
LEFT TIBIA AND FIBULA - 2 VIEW

[tibia ap (1 of 2)]
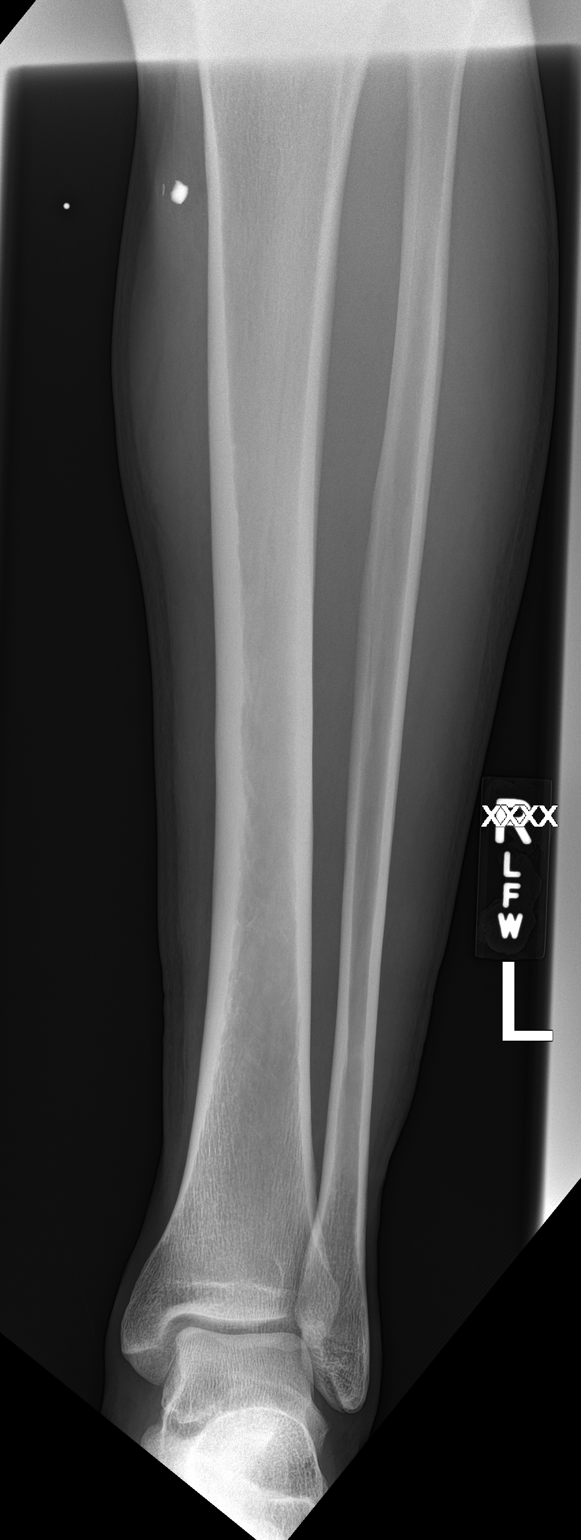

[tibia ap (2 of 2)]
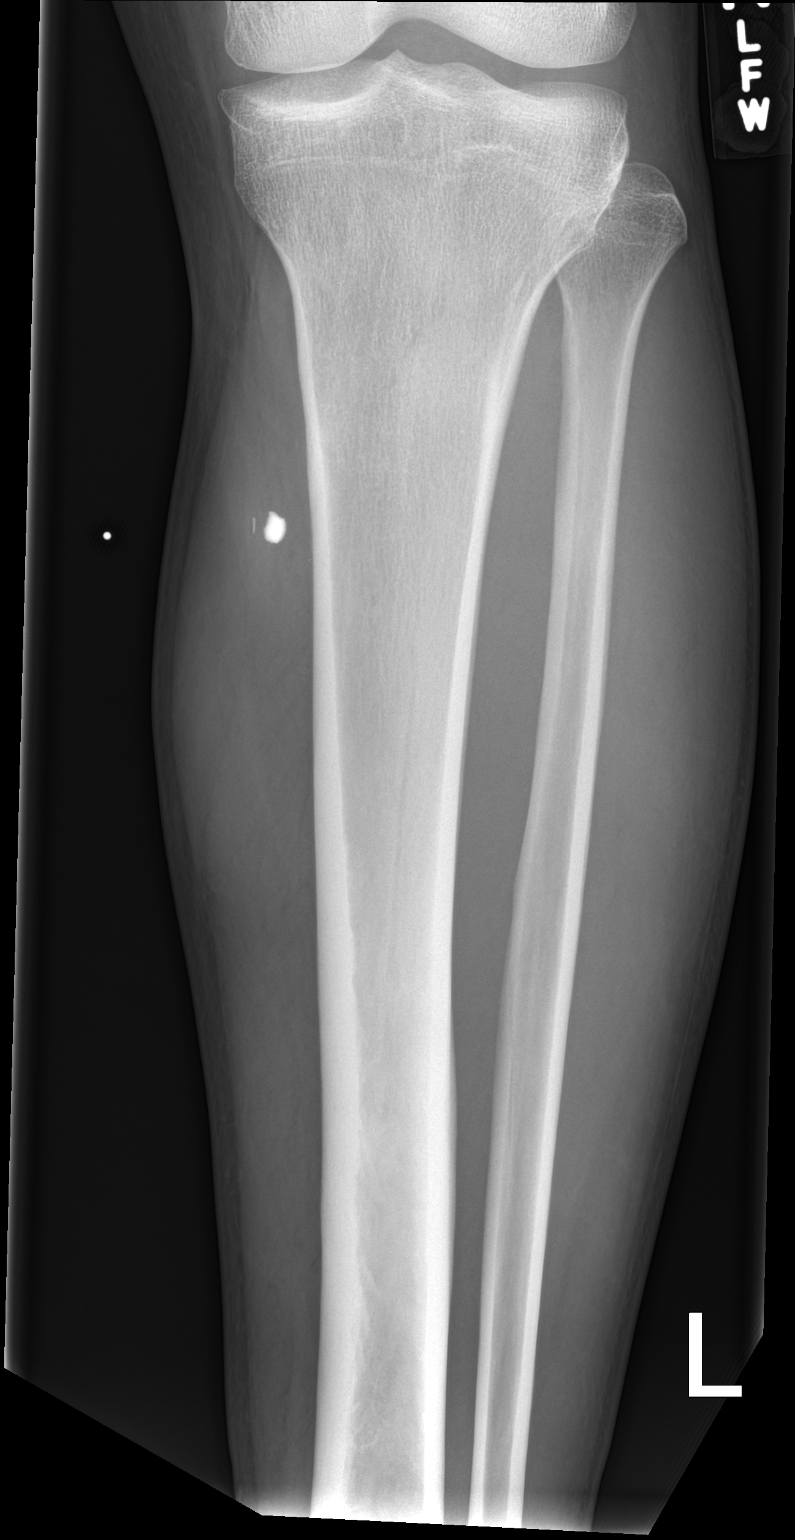

[tibia lat (1 of 2)]
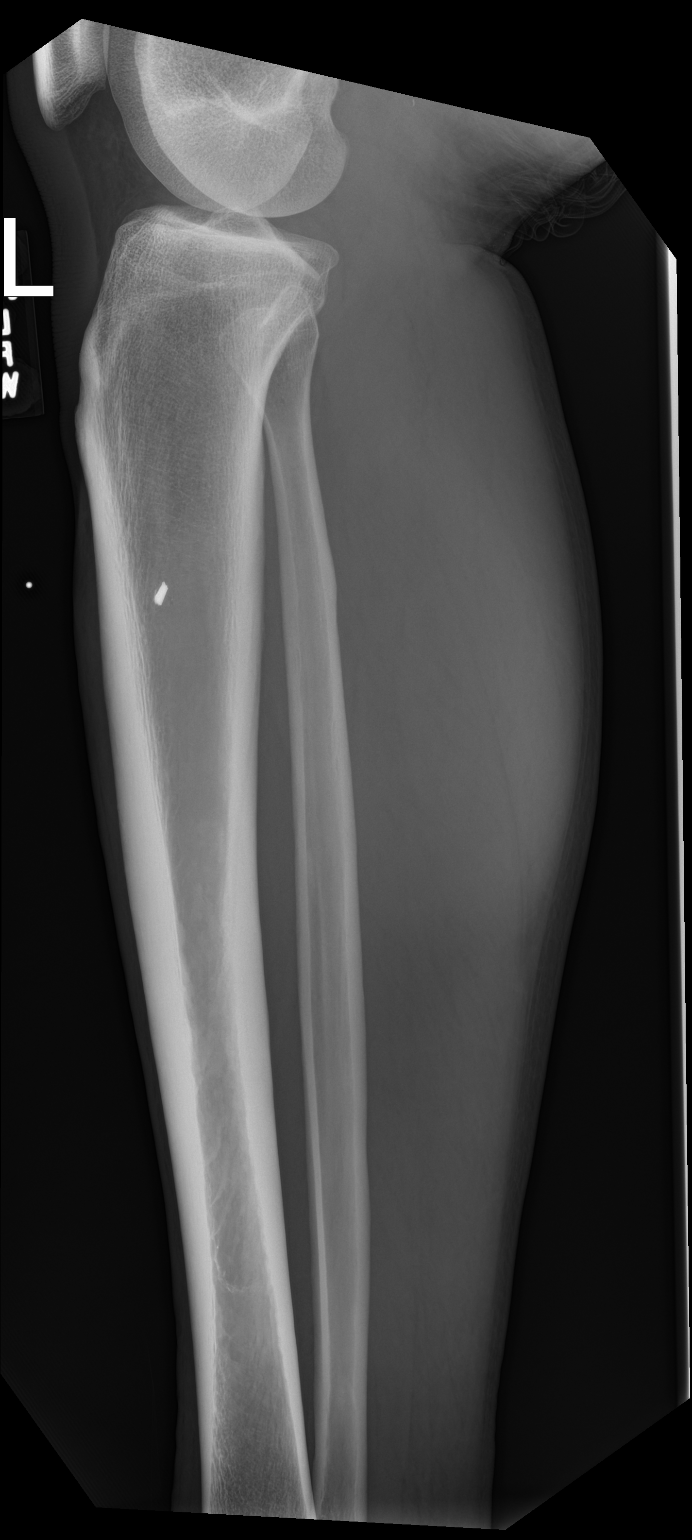

[tibia lat (2 of 2)]
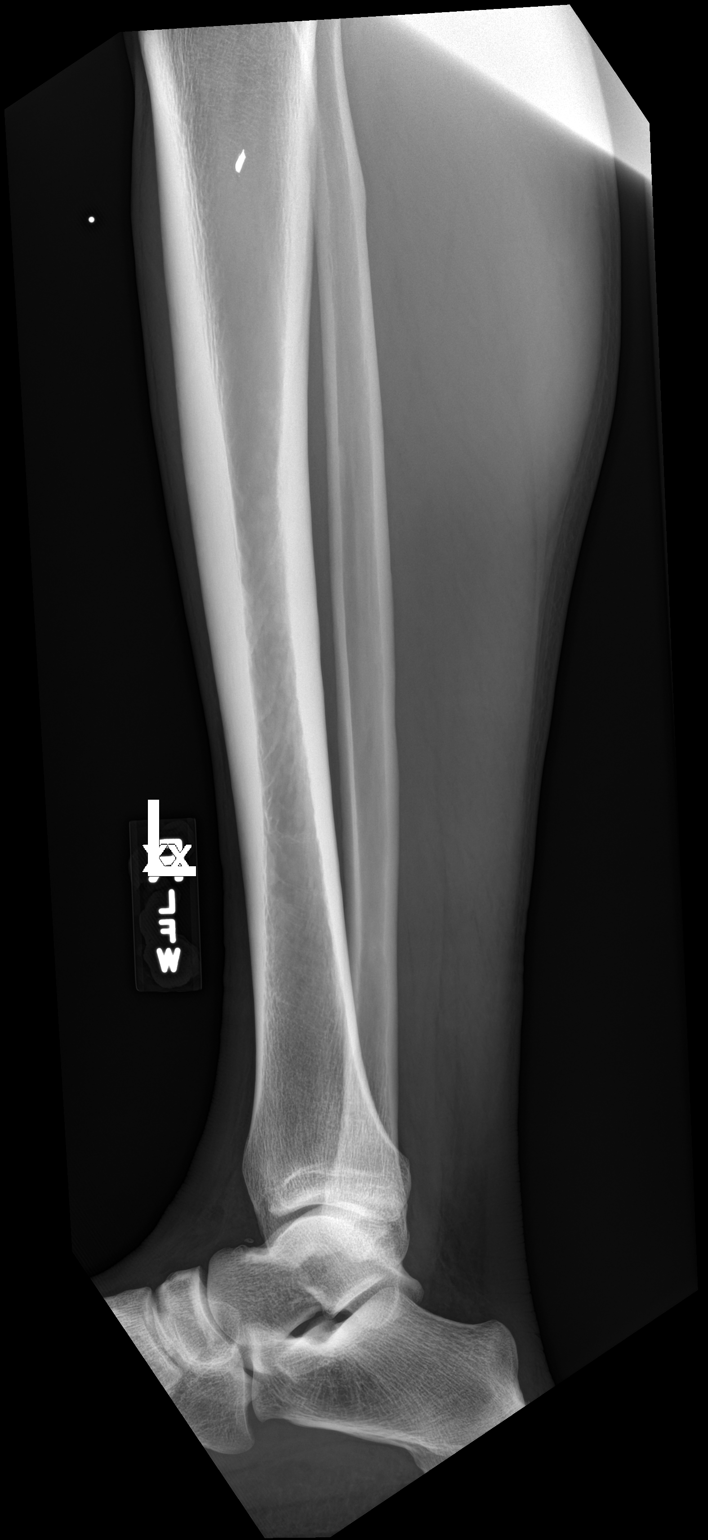

[4 of 4 positions shown; findings below may reference images not displayed]

FINDINGS: A fiducial marker was placed over the area of concern. Within the
underlying soft tissues there are two small radio-opaque foreign
bodies which up to 7 mm. No fracture or subluxation identified.
IMPRESSION: 1. Radio-opaque foreign bodies are identified within the medial soft
tissues of the right lower leg.
